# Patient Record
Sex: Male | Born: 1995 | Race: White | Hispanic: No | Marital: Single | Smoking: Never smoker
Health system: Southern US, Community
[De-identification: ages and names within clinical notes are randomized; demographics above are authoritative.]

## PROBLEM LIST (undated history)

## (undated) HISTORY — PX: TONSILLECTOMY: SUR1361

## (undated) HISTORY — PX: ADENOIDECTOMY: SUR15

---
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
## 2017-08-20 ENCOUNTER — Ambulatory Visit (INDEPENDENT_AMBULATORY_CARE_PROVIDER_SITE_OTHER): Payer: BC Managed Care – PPO | Admitting: Sports Medicine

## 2017-08-20 ENCOUNTER — Encounter: Payer: Self-pay | Admitting: Sports Medicine

## 2017-08-20 VITALS — BP 120/64 | Ht 71.0 in | Wt 145.0 lb

## 2017-08-20 DIAGNOSIS — M7631 Iliotibial band syndrome, right leg: Secondary | ICD-10-CM | POA: Diagnosis not present

## 2017-08-20 MED ORDER — METHYLPREDNISOLONE ACETATE 40 MG/ML IJ SUSP
40.0000 mg | Freq: Once | INTRAMUSCULAR | Status: AC
  Administered 2017-08-20: 40 mg via INTRA_ARTICULAR

## 2017-08-20 NOTE — Progress Notes (Signed)
Chief complaint: Right lateral knee pain x 5 weeks  History of present illness: Daniel Conley is a 22 year old male who presents to the sports medicine office today with chief complaint of right lateral knee pain.  He reports that symptoms have been present for approximately 5 weeks.  He does not report of any specific inciting incident, trauma, or injury to explain the pain.  He is an avid cross country runner.  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  He reports that he has had gradual onset of worsening right lateral knee pain that has limited his running to only 10 miles a week, doing 3 runs during the week.  The rest of the week he does cross train with biking.  He reports noticing the pain as soon as he starts running, progressively worsens to where he has to stop at 25 minutes of running secondary to pain.  He also notices pain going up and down stairs.  He does not report of any popping, catching, or symptoms of giving way.  He reports that at work randomly he will have a minute episode of locking in his right knee.  He does not report of any warmth, erythema, ecchymosis, or effusion.  No previous right knee injury.  He reports of occasionally taking Advil, which he reports not noticing much of a difference with.  He has not used any icing, heat, or bracing.  He has not had any fevers, chills, night sweats, or any unintentional weight loss.  He reports subsequently starting to feel pain under the right great toe where he had a minor fracture of the fibular sesamoid of the right foot as seen under ultrasound guidance last year.  Review of systems:  As stated above  Interval past medical history, surgical history, family history, and social history obtained and unchanged.  He is otherwise healthy, no medical conditions.  No current tobacco use.  Surgical history notable for tonsillectomy, adenoidectomy, and open reduction internal fixation of radial fracture in  2013, family history noncontributory orthopedic complaint  Physical exam: Vital signs are reviewed and are documented in the chart Gen.: Alert, oriented, appears stated age, in no apparent distress HEENT: Moist oral mucosa Respiratory: Normal respirations, able to speak in full sentences Cardiac: Regular rate, distal pulses 2+ Integumentary: No rashes on visible skin:  Neurologic: He does have slight weakness with hip abduction and hip flexion strength on the right side, would categorize this as 4+/5, he does have great quadriceps and hamstring strength, would categorize this as 5/5, knee flexion and knee extension strength 5/5, sensation 2+ in bilateral lower extremities Psych: Normal affect, mood is described as good Musculoskeletal: Inspection of right knee reveals no obvious deformity or muscle atrophy, no warmth, erythema, ecchymosis, or effusion, he is tender to palpation over the right inferolateral knee near the distal IT band insertion, no tenderness over the proximal or mid IT band, no tenderness over the quadriceps tendon, patellar tendon, medial joint line, or lateral joint line, no signs of ligamentous instability as Lachman, anterior drawer, valgus, and varus stress testing negative, McMurray negative, he does not have any pain elicited with double leg or single leg squat on the right knee, no pain elicited with single leg hop on the right leg  Limited musculoskeletal ultrasound was performed in the office today of his right knee: -Unremarkable quadriceps tendon, no evidence of suprapatellar effusion -Unremarkable appearing medial joint line, normal-appearing medial meniscus, no evidence of effusion -Unremarkable appearing lateral  joint line, normal lateral meniscus, normal popliteus tendon -Unremarkable patellar tendon at both patellar side and tibial tuberosity side -Does have a few pockets of hypoechoic changes noted just proximal to the distal IT band insertion indicative of IT  band bursal swelling  Impression: Right distal IT band bursitis  Ultrasound performed and interpreted by: [name] Lake, MD and Enid BaasKarl Creedence Kunesh, MD  Procedure:  After written informed conHaynes Kernssent signed and obtained, and benefit of pain relief and risk of bleeding, infection, and steroid failure discussed, Gerilyn PilgrimJacob agreed to proceed with cortisone injection into the right distal IT band bursa. After timeout, area overlying his right distal IT band bursa was cleaned with Betadine and alcohol wipes. Ethyl chloride was used as topical anesthetic spray. Using 2 cc 1% lidocaine without epinephrine and 1 cc of 40 mg Depo-Medrol on a 22-gauge 1.5 inch needle, his right distal IT band bursa was injected. He did have immediate relief of symptoms, indicating accurate placement of medication. He did not have any bleeding afterwards. No complications noted from procedure.  Assessment and plan: 1.  Distal IT band bursitis 2.  History of right probable minor fracture of fibular sesamoid bone of right foot as seen on ultrasound in April 2018, with aggravation of symptoms once problem #1 started  Plan: Discussed with Gerilyn PilgrimJacob today that his symptoms seem to be consistent with right distal IT band bursitis.  Discussed based on physical exam and ultrasound he has no evidence of any intra-articular pathology.  Given that he is more of an advanced runner, this does fit the bill.  His symptoms do not seem to line up with patellofemoral pain syndrome.  Discussed options today to include systemic anti-inflammatory medications to be used regularly over the next 2 weeks or a cortisone injection into the right distal IT band bursa.  He would like to opt for the latter option.  Do feel that this is a reasonable option.  Injection done as noted above without any complications noted.  Discussed not do any running or biking over the next 48 hours as cortisone can soften the tissues to make it more vulnerable for rupture.  Discussed icing,  stretching, and rehab focusing on strengthening the hip flexors, hip abductors, and IT band.  Discussed if he does not have any interval improvement in symptoms in the next 4-6 weeks to return to office.  This should hopefully help out with his foot pain as well.  Otherwise, he will return sooner on as-needed basis.   Haynes Kernshristopher Lake, M.D. Primary Care Sports Medicine Fellow [facility]  I observed and examined the patient with theSM  resident and agree with assessment and plan.  Note reviewed and modified by me.  Sterling BigKB Nekisha Mcdiarmid, MD

## 2018-06-27 ENCOUNTER — Encounter: Payer: Self-pay | Admitting: Family Medicine

## 2018-06-27 ENCOUNTER — Ambulatory Visit: Payer: BC Managed Care – PPO | Admitting: Family Medicine

## 2018-06-27 VITALS — BP 110/70 | HR 55 | Temp 98.1°F | Ht 70.0 in | Wt 153.2 lb

## 2018-06-27 DIAGNOSIS — R51 Headache: Secondary | ICD-10-CM | POA: Diagnosis not present

## 2018-06-27 DIAGNOSIS — R519 Headache, unspecified: Secondary | ICD-10-CM

## 2018-06-27 NOTE — Progress Notes (Signed)
   Subjective:    Patient ID: Daniel Conley, male    DOB: 12-Feb-1996, 23 y.o.   MRN: [id number]  HPI He is here as a new patient for evaluation of a several month history of headache.  He just says that he can wake up in the morning without headache but after an hour feels symptoms in between his ears.  He also says that he sometimes has foggy sensation.  No blurred vision, double vision, nausea, vomiting, weakness.  No fever, chills, sore throat, postnasal drainage.  No sneezing, itchy watery eyes, rhinorrhea he was seen for that at school and no cause was noted.  He was then seen in an urgent care center and told that he had a tension headache.  He did note that when he came home for Thanksgiving, the headache tended to be less of a concern.  He does not feel as he is under any more stress than normal.  He states that Advil does help.  He does not use street drugs.  He is on no other medications.   Review of Systems     Objective:   Physical Exam Alert and in no distress.  EOMI.  Other cranial nerves grossly intact.  DTRs normal.  Tympanic membranes and canals are normal. Pharyngeal area is normal. Neck is supple without adenopathy or thyromegaly. Cardiac exam shows a regular sinus rhythm without murmurs or gallops. Lungs are clear to auscultation.        Assessment & Plan:  Nonintractable headache, unspecified chronicity pattern, unspecified headache type The headache is most consistent with tension headache although he notes no particular tensions and there is nothing psychologically going on with him.  Recommend he keep track of this in terms of what makes it better or worse.  Also recommend taking Advil his use of done in the past.  He can return here as needed.  Discussed the fact that we would like to try and work towards preventing headache from occurring rather than just treating it.

## 2018-12-30 ENCOUNTER — Other Ambulatory Visit: Payer: Self-pay | Admitting: *Deleted

## 2018-12-30 DIAGNOSIS — Z20822 Contact with and (suspected) exposure to covid-19: Secondary | ICD-10-CM

## 2019-01-04 LAB — NOVEL CORONAVIRUS, NAA: SARS-CoV-2, NAA: NOT DETECTED

## 2019-08-30 ENCOUNTER — Ambulatory Visit: Payer: BC Managed Care – PPO | Attending: Internal Medicine

## 2019-08-30 DIAGNOSIS — Z23 Encounter for immunization: Secondary | ICD-10-CM | POA: Insufficient documentation

## 2019-08-30 NOTE — Progress Notes (Signed)
   Covid-19 Vaccination Clinic  Name:  HEATON SARIN    MRN: [id number] DOB: 03/18/1996  08/30/2019  Mr. Murata was observed post Covid-19 immunization for 15 minutes without incident. He was provided with Vaccine Information Sheet and instruction to access the V-Safe system.   Mr. Toro was instructed to call 911 with any severe reactions post vaccine: Marland Kitchen Difficulty breathing  . Swelling of face and throat  . A fast heartbeat  . A bad rash all over body  . Dizziness and weakness   Immunizations Administered    Name Date Dose VIS Date Route   Pfizer COVID-19 Vaccine 08/30/2019  6:34 PM 0.3 mL 06/05/2019 Intramuscular   Manufacturer: [employer], [employer]   Lot: [id number]   NDC: [address]-5

## 2019-09-30 ENCOUNTER — Ambulatory Visit: Payer: Self-pay

## 2019-09-30 ENCOUNTER — Ambulatory Visit: Payer: BC Managed Care – PPO | Attending: Internal Medicine

## 2019-09-30 DIAGNOSIS — Z23 Encounter for immunization: Secondary | ICD-10-CM

## 2019-09-30 NOTE — Progress Notes (Signed)
   Covid-19 Vaccination Clinic  Name:  Daniel Conley    MRN: [id number] DOB: 09-21-1995  09/30/2019  [name] was observed post Covid-19 immunization for 15 minutes without incident. He was provided with Vaccine Information Sheet and instruction to access the V-Safe system.   [name] was instructed to call 911 with any severe reactions post vaccine: Marland Kitchen Difficulty breathing  . Swelling of face and throat  . A fast heartbeat  . A bad rash all over body  . Dizziness and weakness   Immunizations Administered    Name Date Dose VIS Date Route   Pfizer COVID-19 Vaccine 09/30/2019  8:39 AM 0.3 mL 06/05/2019 Intramuscular   Manufacturer: [employer], [employer]   Lot: [id number]   NDC: [address]-1

## 2022-02-28 ENCOUNTER — Encounter: Payer: Self-pay | Admitting: Internal Medicine

## 2022-04-03 ENCOUNTER — Encounter: Payer: Self-pay | Admitting: Internal Medicine

## 2022-04-16 ENCOUNTER — Encounter: Payer: Self-pay | Admitting: Internal Medicine

## 2023-08-20 ENCOUNTER — Encounter: Payer: Self-pay | Admitting: Internal Medicine
